# Patient Record
Sex: Male | Born: 1985 | Race: Black or African American | Hispanic: No | Marital: Single | Smoking: Current every day smoker
Health system: Southern US, Community
[De-identification: ages and names within clinical notes are randomized; demographics above are authoritative.]

---

## 2015-01-18 ENCOUNTER — Emergency Department (HOSPITAL_COMMUNITY): Payer: Self-pay

## 2015-01-18 ENCOUNTER — Encounter (HOSPITAL_COMMUNITY): Payer: Self-pay | Admitting: Emergency Medicine

## 2015-01-18 ENCOUNTER — Emergency Department (HOSPITAL_COMMUNITY)
Admission: EM | Admit: 2015-01-18 | Discharge: 2015-01-19 | Disposition: A | Discharge status: Home / Self Care | Payer: Self-pay | Attending: Emergency Medicine | Admitting: Emergency Medicine

## 2015-01-18 DIAGNOSIS — S61216A Laceration without foreign body of right little finger without damage to nail, initial encounter: Secondary | ICD-10-CM | POA: Insufficient documentation

## 2015-01-18 DIAGNOSIS — Y998 Other external cause status: Secondary | ICD-10-CM | POA: Insufficient documentation

## 2015-01-18 DIAGNOSIS — S61219A Laceration without foreign body of unspecified finger without damage to nail, initial encounter: Secondary | ICD-10-CM

## 2015-01-18 DIAGNOSIS — W25XXXA Contact with sharp glass, initial encounter: Secondary | ICD-10-CM | POA: Insufficient documentation

## 2015-01-18 DIAGNOSIS — Y9389 Activity, other specified: Secondary | ICD-10-CM | POA: Insufficient documentation

## 2015-01-18 DIAGNOSIS — S6991XA Unspecified injury of right wrist, hand and finger(s), initial encounter: Secondary | ICD-10-CM

## 2015-01-18 DIAGNOSIS — Y9289 Other specified places as the place of occurrence of the external cause: Secondary | ICD-10-CM | POA: Insufficient documentation

## 2015-01-18 DIAGNOSIS — Z720 Tobacco use: Secondary | ICD-10-CM | POA: Insufficient documentation

## 2015-01-18 MED ORDER — HYDROCODONE-ACETAMINOPHEN 5-325 MG PO TABS
1.0000 | ORAL_TABLET | Freq: Once | ORAL | Status: AC
Start: 2015-01-18 — End: 2015-01-18
  Administered 2015-01-18: 1 via ORAL
  Filled 2015-01-18: qty 1

## 2015-01-18 MED ORDER — LIDOCAINE HCL (PF) 1 % IJ SOLN
10.0000 mL | Freq: Once | INTRAMUSCULAR | Status: AC
  Administered 2015-01-18: 10 mL
  Filled 2015-01-18: qty 10

## 2015-01-18 NOTE — ED Provider Notes (Signed)
CSN: [id number]     Arrival date & time 01/18/15  2252 History   This chart was scribed for Levi StraussMercedes Camprubi-Soms, PA-C Geoffery Lyonsouglas Delo, MD by Evon Slackerrance Branch, ED Scribe. This patient was seen in room TR07C/TR07C and the patient's care was started at 11:04 PM.    Chief Complaint  Patient presents with  . Finger Injury   Patient is a 29 y.o. male presenting with hand injury. The history is provided by the patient. No language interpreter was used.  Hand Injury Location:  Finger Time since incident:  9 hours Injury: yes   Mechanism of injury comment:  Punched glass Finger location:  R little finger Pain details:    Quality:  Throbbing   Radiates to:  Does not radiate   Severity:  Moderate   Onset quality:  Sudden   Duration:  9 hours   Timing:  Constant   Progression:  Unchanged Chronicity:  New Handedness:  Right-handed Foreign body present:  No foreign bodies Tetanus status:  Up to date Prior injury to area:  No Relieved by:  Nothing Worsened by:  Movement Ineffective treatments:  NSAIDs Associated symptoms: swelling   Associated symptoms: no decreased range of motion, no numbness and no tingling    HPI Comments: Albert Wilkerson is a 29 y.o. healthy male who presents to the Emergency Department complaining of right little finger injury onset today at 2 PM after punching some glass. Pt presents with a laceration to the right 5th digit. Pt rates the pain in his hand is a 8/10 and constant, nonradiating. Pt states that the bleeding was controlled with pressure after about 30 minutes but states that the bleeding has started again. Pt states that the pain is worse with movement. Pt states that he is right hand dominant. Pt states that he has tried ibuprofen with no relief. Endorses associated swelling to the hand. Denies squirting bleeding, just oozing. Pt denies numbness, tingling, weakness, loss of ROM, wrist pain, light headedness, dizziness, or bleeding conditions/blood thinner use. Pt  states that his last TDAP was 2014.   History reviewed. No pertinent past medical history. History reviewed. No pertinent past surgical history. History reviewed. No pertinent family history. History  Substance Use Topics  . Smoking status: Current Every Day Smoker  . Smokeless tobacco: Not on file  . Alcohol Use: No    Review of Systems  Constitutional: Negative for activity change.  Respiratory: Negative for shortness of breath.   Cardiovascular: Negative for chest pain.  Gastrointestinal: Negative for nausea, vomiting, abdominal pain and diarrhea.  Musculoskeletal: Positive for joint swelling (R hand) and arthralgias (R hand).  Skin: Positive for wound.  Allergic/Immunologic: Negative for immunocompromised state.  Neurological: Negative for dizziness, weakness, light-headedness and numbness.  Hematological: Does not bruise/bleed easily.  Psychiatric/Behavioral: Negative for confusion.  10 Systems reviewed and all are negative for acute change except as noted in the HPI.    Allergies  Review of patient's allergies indicates no known allergies.  Home Medications   Prior to Admission medications   Not on File   BP 118/75 mmHg  Pulse 95  Temp(Src) 98.3 F (36.8 C) (Oral)  Resp 16  Ht 6\' 1"  (1.854 m)  Wt 200 lb (90.719 kg)  BMI 26.39 kg/m2  SpO2 97%   Physical Exam  Constitutional: He is oriented to person, place, and time. Vital signs are normal. He appears well-developed and well-nourished.  Non-toxic appearance. No distress.  Afebrile, nontoxic, NAD  HENT:  Head: Normocephalic and atraumatic.  Mouth/Throat: Mucous membranes are normal.  Eyes: Conjunctivae and EOM are normal. Right eye exhibits no discharge. Left eye exhibits no discharge.  Neck: Normal range of motion. Neck supple.  Cardiovascular: Normal rate and intact distal pulses.   Pulmonary/Chest: Effort normal. No respiratory distress.  Abdominal: Normal appearance. He exhibits no distension.   Musculoskeletal: Normal range of motion.       Right hand: He exhibits bony tenderness, laceration and swelling. He exhibits normal range of motion, normal two-point discrimination and normal capillary refill. Normal sensation noted. Normal strength noted.       Hands: R hand with mild TTP over 4th-5th metacarpals, mild swelling, no crepitus, with FROM intact in all digits, with small 1cm jagged laceration to palmar aspect of pinky over proximal phalanx, superficial, without retained FB or deep structure injuries, no arterial bleeding. Sensation and strength grossly intact, cap refill brisk and present. Distal pulses intact.   Neurological: He is alert and oriented to person, place, and time. He has normal strength. No sensory deficit.  Skin: Skin is warm and dry. No rash noted.  R pinky lac with no ongoing bleeding, as noted above  Psychiatric: He has a normal mood and affect.  Nursing note and vitals reviewed.   ED Course  LACERATION REPAIR Date/Time: 01/19/2015 12:31 AM Performed by: Allen Derry Authorized by: Allen Derry Consent: Verbal consent obtained. Risks and benefits: risks, benefits and alternatives were discussed Consent given by: patient Patient understanding: patient states understanding of the procedure being performed Patient consent: the patient's understanding of the procedure matches consent given Patient identity confirmed: verbally with patient Body area: upper extremity Location details: right small finger Laceration length: 1 cm Foreign bodies: no foreign bodies Tendon involvement: none Nerve involvement: none Vascular damage: no Anesthesia: local infiltration Local anesthetic: lidocaine 1% without epinephrine Anesthetic total: 2 ml Patient sedated: no Preparation: Patient was prepped and draped in the usual sterile fashion. Irrigation solution: saline Irrigation method: syringe Amount of cleaning: standard Debridement: none Degree  of undermining: none Skin closure: 4-0 Prolene Number of sutures: 2 Technique: simple Approximation: close Approximation difficulty: simple Dressing: 4x4 sterile gauze and splint Patient tolerance: Patient tolerated the procedure well with no immediate complications   (including critical care time) DIAGNOSTIC STUDIES: Oxygen Saturation is 97% on RA, normal by my interpretation.    COORDINATION OF CARE: 11:13 PM-Discussed treatment plan with pt at bedside and pt agreed to plan.     Labs Review Labs Reviewed - No data to display  Imaging Review Dg Hand Complete Right  01/18/2015   CLINICAL DATA:  Punched glass with laceration to the little digit. Evaluate for foreign body or fracture.  EXAM: RIGHT HAND - COMPLETE 3+ VIEW  COMPARISON:  None.  FINDINGS: No opaque foreign body, fracture, or dislocation.  There is remote posttraumatic deformity to the fifth metacarpal neck and fifth proximal phalanx base.  IMPRESSION: 1. No acute bony finding or opaque foreign body. 2. Remote fifth metacarpal and fifth proximal phalanx fractures.   Electronically Signed   By: Marnee Spring M.D.   On: 01/18/2015 23:55     EKG Interpretation None      MDM   Final diagnoses:  Hand injuries, right, initial encounter  Laceration of finger, right, initial encounter    29 y.o. male here with R pinky lac after punching wall. Mild TTP to hand over 4th-5th metacarpals. Neurovascularly intact with soft compartments, FROM intact in digits. Will obtain imaging to eval for fx or retained FB.  Will irrigate and proceed with wound repair since pt presenting <12hrs after injury.   12:32 AM Xray neg. Lac irrigated with copious amount of NS, very superficial, laceration repaired with 2 sutures, splint and dressing applied. Will have him f/up with urgent care in 7 days for suture removal. Discussed wound care and s/sx of infection to watch for. I explained the diagnosis and have given explicit precautions to return  to the ER including for any other new or worsening symptoms. The patient understands and accepts the medical plan as it's been dictated and I have answered their questions. Discharge instructions concerning home care and prescriptions have been given. The patient is STABLE and is discharged to home in good condition.   I personally performed the services described in this documentation, which was scribed in my presence. The recorded information has been reviewed and is accurate.  BP 118/75 mmHg  Pulse 95  Temp(Src) 98.3 F (36.8 C) (Oral)  Resp 16  Ht  (1.854 m)  Wt 200 lb (90.719 kg)  BMI 26.39 kg/m2  SpO2 97%  Meds ordered this encounter  Medications  . lidocaine (PF) (XYLOCAINE) 1 % injection 10 mL    Sig:   . HYDROcodone-acetaminophen (NORCO/VICODIN) 5-325 MG per tablet 1 tablet    Sig:   . naproxen (NAPROSYN) 500 MG tablet    Sig: Take 1 tablet (500 mg total) by mouth 2 (two) times daily as needed for mild pain, moderate pain or headache (TAKE WITH MEALS.).    Dispense:  20 tablet    Refill:  0    Order Specific Question:  Supervising Provider    Answer:  MILLER, BRIAN [[id number]]  . HYDROcodone-acetaminophen (NORCO) 5-325 MG per tablet    Sig: Take 1 tablet by mouth every 6 (six) hours as needed for severe pain.    Dispense:  8 tablet    Refill:  0    Order Specific Question:  Supervising Provider    Answer:  Eber Hong [[id number]]     Bryanne Riquelme Camprubi-Soms, PA-C 01/19/15 0037  Geoffery Lyons, MD 01/19/15 [phone]

## 2015-01-18 NOTE — ED Notes (Signed)
Patient states he punched some glass earlier in the day, right pinky finger with laceration, bleeding controlled.  Patient has some swelling to the finger and palm of hand.

## 2015-01-19 MED ORDER — HYDROCODONE-ACETAMINOPHEN 5-325 MG PO TABS: 1.0000 | ORAL_TABLET | Freq: Four times a day (QID) | ORAL | Status: AC | PRN

## 2015-01-19 MED ORDER — NAPROXEN 500 MG PO TABS: 500.0000 mg | ORAL_TABLET | Freq: Two times a day (BID) | ORAL | Status: AC | PRN

## 2015-01-19 NOTE — Discharge Instructions (Signed)
Keep wound and clean with mild soap and water. Keep area covered with a topical antibiotic ointment and bandage and splint, keep bandage dry, and do not submerge in water for 24 hours. Ice and elevate for additional pain relief and swelling. Alternate between naprosyn and norco for additional pain relief but don't drive while taking norco. Follow up with your primary care doctor or the [facility] in approximately 7 days for wound recheck and suture removal. Monitor area for signs of infection to include, but not limited to: increasing pain, redness, drainage/pus, or swelling. Return to emergency department for emergent changing or worsening symptoms.    Laceration Care, Adult A laceration is a cut or lesion that goes through all layers of the skin and into the tissue just beneath the skin. TREATMENT  Some lacerations may not require closure. Some lacerations may not be able to be closed due to an increased risk of infection. It is important to see your caregiver as soon as possible after an injury to minimize the risk of infection and maximize the opportunity for successful closure. If closure is appropriate, pain medicines may be given, if needed. The wound will be cleaned to help prevent infection. Your caregiver will use stitches (sutures), staples, wound glue (adhesive), or skin adhesive strips to repair the laceration. These tools bring the skin edges together to allow for faster healing and a better cosmetic outcome. However, all wounds will heal with a scar. Once the wound has healed, scarring can be minimized by covering the wound with sunscreen during the day for 1 full year. HOME CARE INSTRUCTIONS  For sutures or staples:  Keep the wound clean and dry.  If you were given a bandage (dressing), you should change it at least once a day. Also, change the dressing if it becomes wet or dirty, or as directed by your caregiver.  Wash the wound with soap and water 2 times a day.  Rinse the wound off with water to remove all soap. Pat the wound dry with a clean towel.  After cleaning, apply a thin layer of the antibiotic ointment as recommended by your caregiver. This will help prevent infection and keep the dressing from sticking.  You may shower as usual after the first 24 hours. Do not soak the wound in water until the sutures are removed.  Only take over-the-counter or prescription medicines for pain, discomfort, or fever as directed by your caregiver.  Get your sutures or staples removed as directed by your caregiver. For skin adhesive strips:  Keep the wound clean and dry.  Do not get the skin adhesive strips wet. You may bathe carefully, using caution to keep the wound dry.  If the wound gets wet, pat it dry with a clean towel.  Skin adhesive strips will fall off on their own. You may trim the strips as the wound heals. Do not remove skin adhesive strips that are still stuck to the wound. They will fall off in time. For wound adhesive:  You may briefly wet your wound in the shower or bath. Do not soak or scrub the wound. Do not swim. Avoid periods of heavy perspiration until the skin adhesive has fallen off on its own. After showering or bathing, gently pat the wound dry with a clean towel.  Do not apply liquid medicine, cream medicine, or ointment medicine to your wound while the skin adhesive is in place. This may loosen the film before your wound is healed.  If  a dressing is placed over the wound, be careful not to apply tape directly over the skin adhesive. This may cause the adhesive to be pulled off before the wound is healed.  Avoid prolonged exposure to sunlight or tanning lamps while the skin adhesive is in place. Exposure to ultraviolet light in the first year will darken the scar.  The skin adhesive will usually remain in place for 5 to 10 days, then naturally fall off the skin. Do not pick at the adhesive film. You may need a tetanus shot  if:  You cannot remember when you had your last tetanus shot.  You have never had a tetanus shot. If you get a tetanus shot, your arm may swell, get red, and feel warm to the touch. This is common and not a problem. If you need a tetanus shot and you choose not to have one, there is a rare chance of getting tetanus. Sickness from tetanus can be serious. SEEK MEDICAL CARE IF:   You have redness, swelling, or increasing pain in the wound.  You see a red line that goes away from the wound.  You have yellowish-white fluid (pus) coming from the wound.  You have a fever.  You notice a bad smell coming from the wound or dressing.  Your wound breaks open before or after sutures have been removed.  You notice something coming out of the wound such as wood or glass.  Your wound is on your hand or foot and you cannot move a finger or toe. SEEK IMMEDIATE MEDICAL CARE IF:   Your pain is not controlled with prescribed medicine.  You have severe swelling around the wound causing pain and numbness or a change in color in your arm, hand, leg, or foot.  Your wound splits open and starts bleeding.  You have worsening numbness, weakness, or loss of function of any joint around or beyond the wound.  You develop painful lumps near the wound or on the skin anywhere on your body. MAKE SURE YOU:   Understand these instructions.  Will watch your condition.  Will get help right away if you are not doing well or get worse. Document Released: 09/17/2005 Document Revised: 12/10/2011 Document Reviewed: 03/13/2011 [facility] Patient Information 2015 [place], [place]. This information is not intended to replace advice given to you by your health care provider. Make sure you discuss any questions you have with your health care provider.  Sutured Wound Care Sutures are stitches that can be used to close wounds. Caring for your wound can help stop infection and lessen pain. HOME CARE   Rest and raise  (elevate) the injured area until the pain and puffiness (swelling) go away.  Only take medicines as told by your doctor.  Clean the wound gently with mild soap and water once a day after the first 2 days. Rinse off the soap. Pat the area dry with a clean towel. Do not rub the wound.  Change the bandage (dressing) as told by your doctor. If the bandage sticks, soak it off with soapy water. Stop using a bandage after 2 days or after the wound stops leaking fluid.  Put cream on the wound as told by your doctor.  Do not stretch the wound.  Drink enough fluids to keep your pee (urine) clear or pale yellow.  See your doctor to have the sutures removed.  Use sunscreen or sunblock on the wound after it heals. GET HELP RIGHT AWAY IF:   Your wound gets  red, puffy, hot, or tender.  You have more pain in the wound.  You have a red streak that goes away from the wound.  You see yellowish-white fluid (pus) coming out of the wound.  You have a fever.  You have chills and start to shake.  You notice a bad smell coming from the wound.  Your wound will not stop bleeding. MAKE SURE YOU:   Understand these instructions.  Will watch your condition.  Will get help right away if you are not doing well or get worse. Document Released: 03/05/2008 Document Revised: 12/10/2011 Document Reviewed: 01/21/2011 [facility] Patient Information 2015 [place], [place]. This information is not intended to replace advice given to you by your health care provider. Make sure you discuss any questions you have with your health care provider.  Wound Check Your wound appears healthy today. Your wound will heal gradually over time. Eventually a scar will form that will fade with time. FACTORS THAT AFFECT SCAR FORMATION:  People differ in the severity in which they scar.  Scar severity varies according to location, size, and the traits you inherited from your parents (genetic predisposition).  Irritation to the  wound from infection, rubbing, or chemical exposure will increase the amount of scar formation. HOME CARE INSTRUCTIONS   If you were given a dressing, you should change it at least once a day or as instructed by your caregiver. If the bandage sticks, soak it off with a solution of hydrogen peroxide.  If the bandage becomes wet, dirty, or develops a bad smell, change it as soon as possible.  Look for signs of infection.  Only take over-the-counter or prescription medicines for pain, discomfort, or fever as directed by your caregiver. SEEK IMMEDIATE MEDICAL CARE IF:   You have redness, swelling, or increasing pain in the wound.  You notice pus coming from the wound.  You have a fever.  You notice a bad smell coming from the wound or dressing. Document Released: 06/23/2004 Document Revised: 12/10/2011 Document Reviewed: 09/17/2005 [facility] Patient Information 2015 [place], [place]. This information is not intended to replace advice given to you by your health care provider. Make sure you discuss any questions you have with your health care provider.

## 2016-04-27 IMAGING — DX DG HAND COMPLETE 3+V*R*
3 series · 3 of 3 positions shown · non-contrast
Comparison: None.

CLINICAL DATA: Punched glass with laceration to the little digit.
Evaluate for foreign body or fracture.

EXAM:
RIGHT HAND - COMPLETE 3+ VIEW

[hand pa]
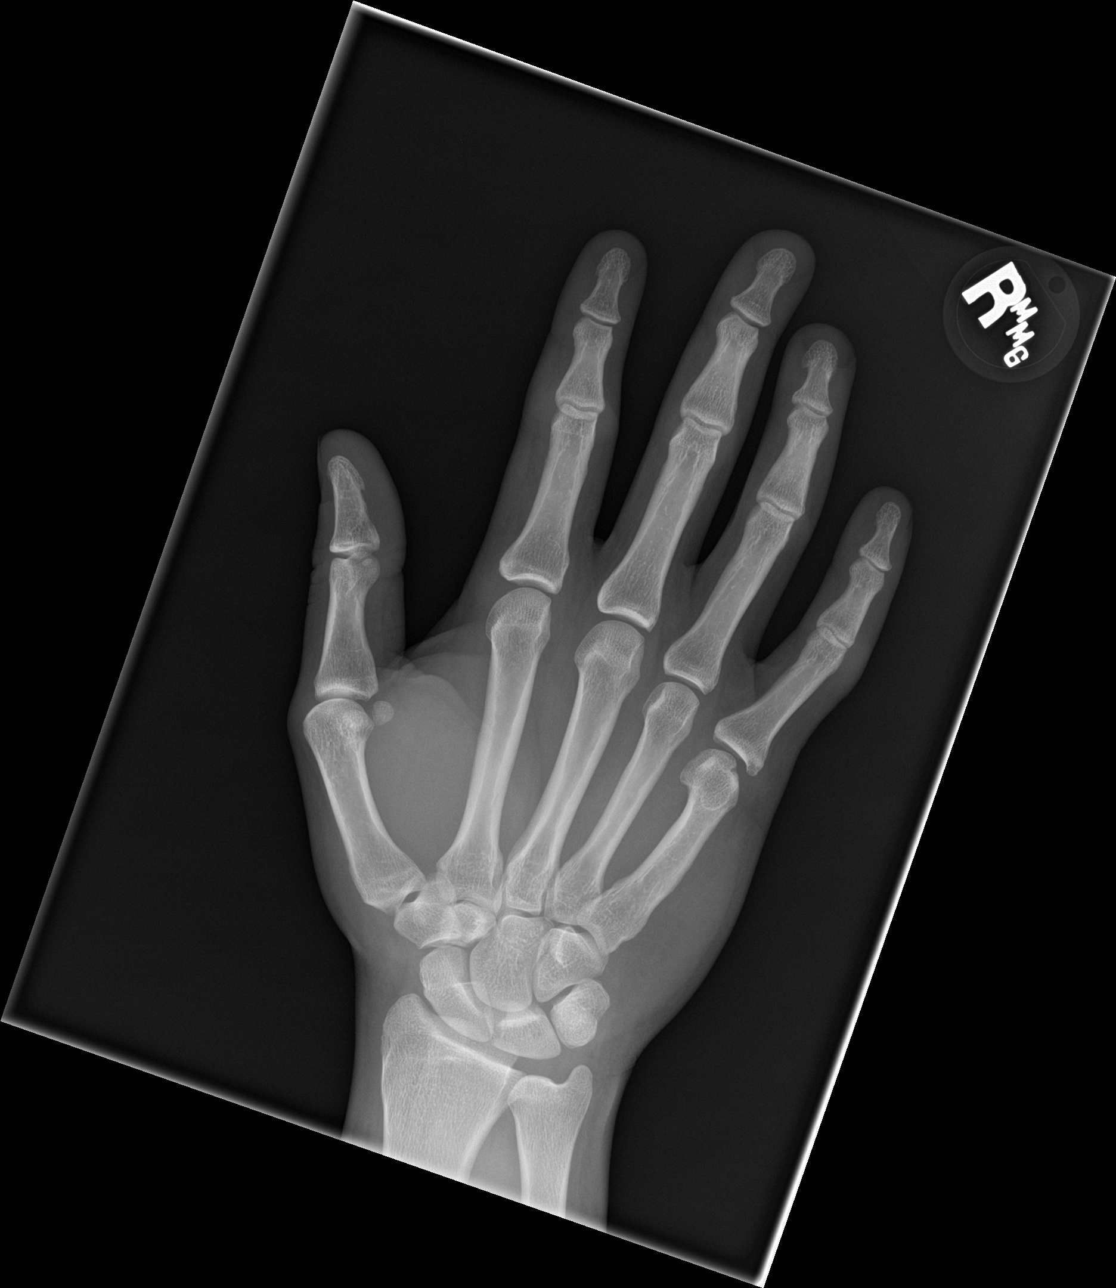

[hand obl]
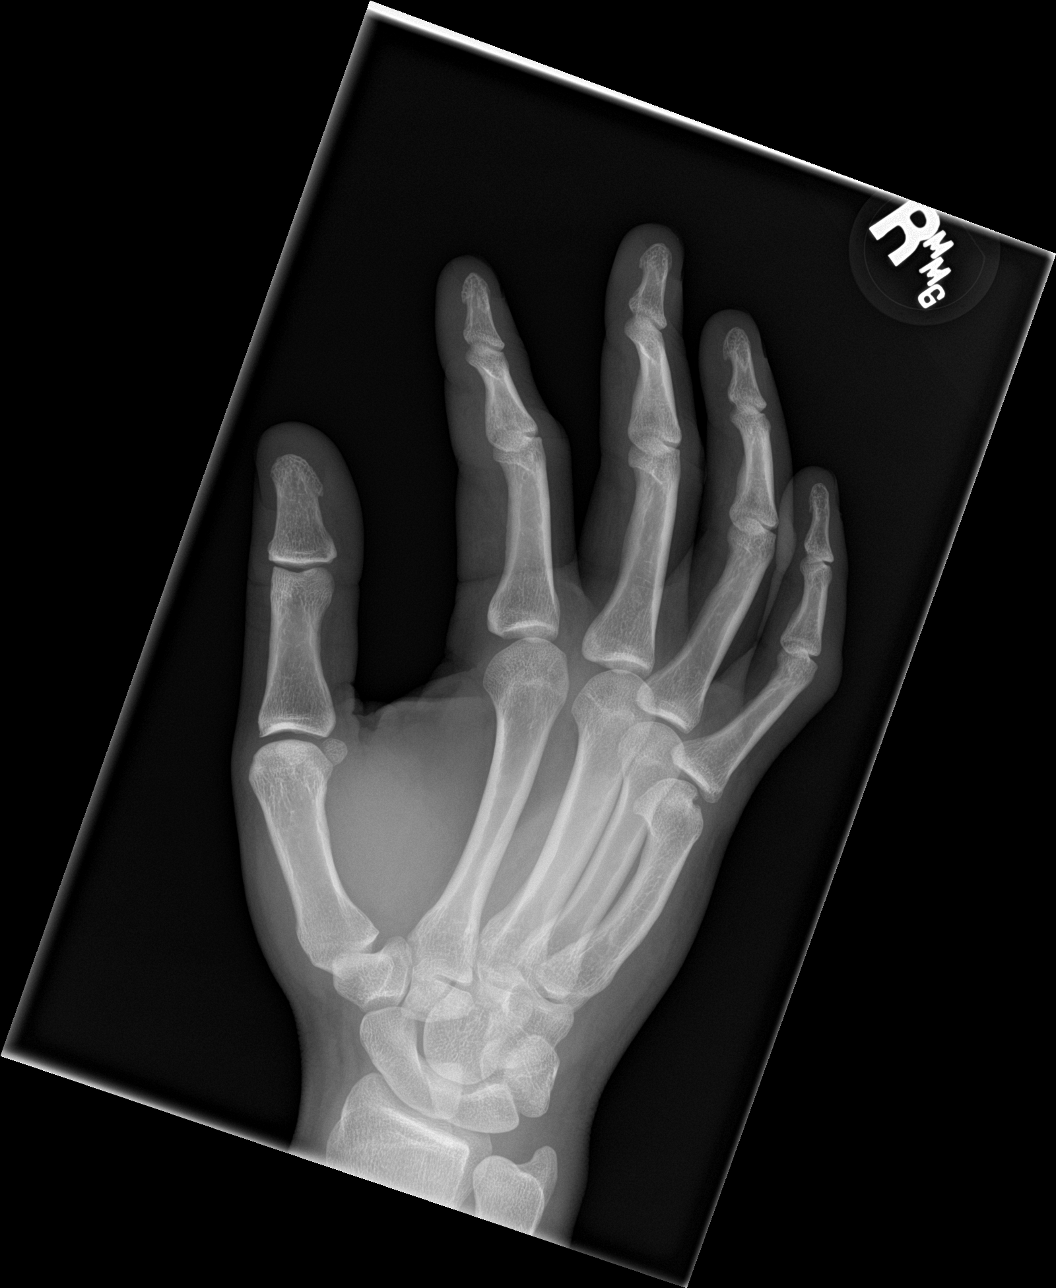

[hand lat]
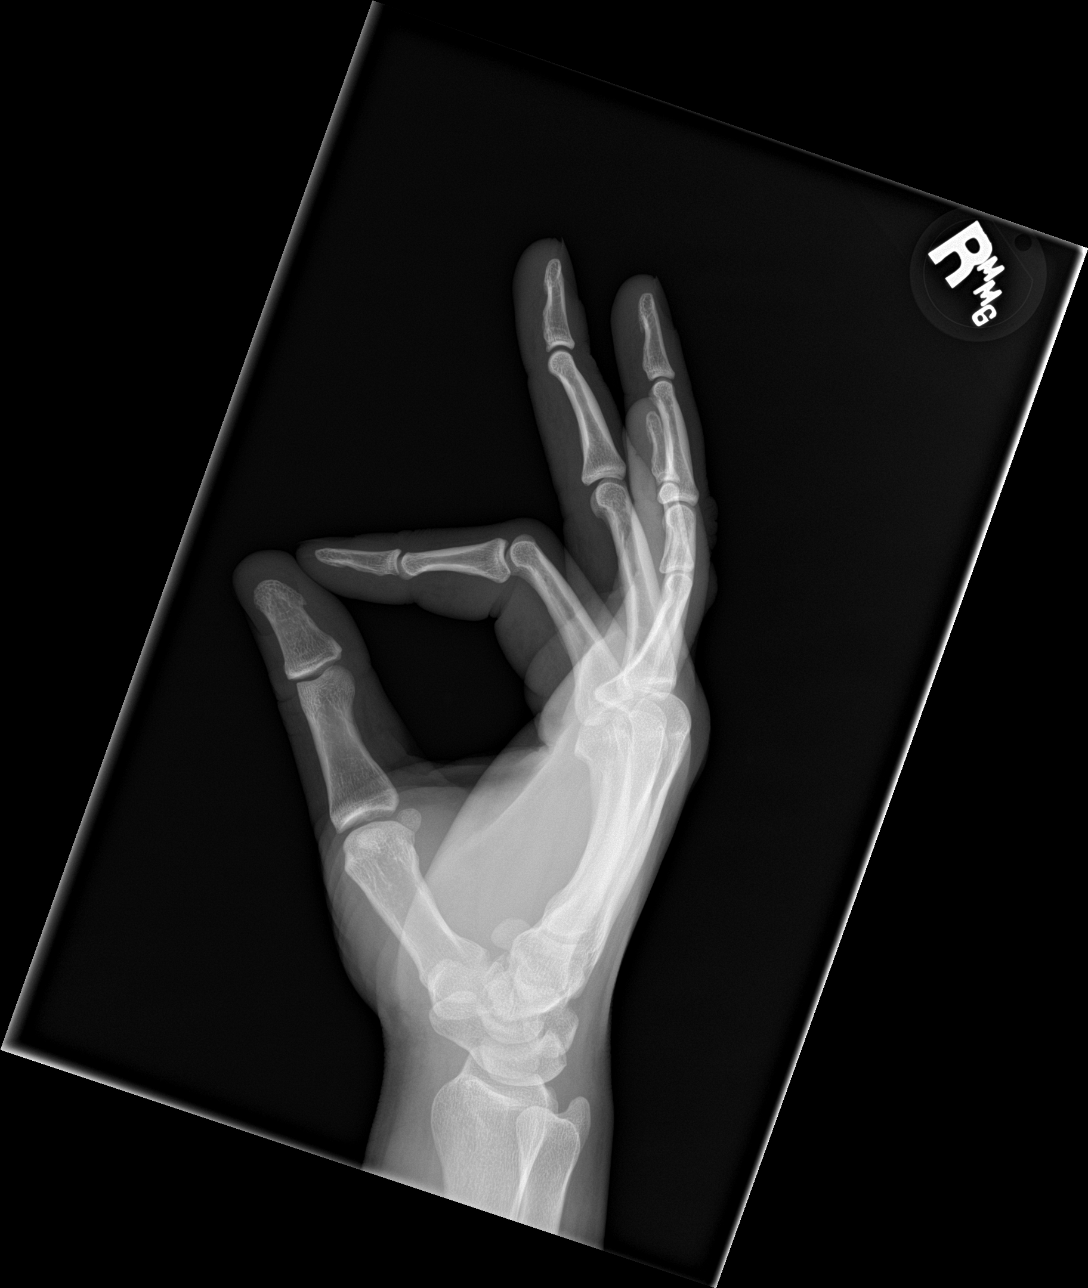

[3 of 3 positions shown; findings below may reference images not displayed]

FINDINGS: No opaque foreign body, fracture, or dislocation.

There is remote posttraumatic deformity to the fifth metacarpal neck
and fifth proximal phalanx base.
IMPRESSION: 1. No acute bony finding or opaque foreign body.
2. Remote fifth metacarpal and fifth proximal phalanx fractures.
